# Patient Record
Sex: Female | Born: 1970 | Hispanic: No | Marital: Single | Smoking: Former smoker
Health system: Southern US, Community
[De-identification: ages and names within clinical notes are randomized; demographics above are authoritative.]

## PROBLEM LIST (undated history)

## (undated) DIAGNOSIS — E069 Thyroiditis, unspecified: Secondary | ICD-10-CM

## (undated) HISTORY — DX: Thyroiditis, unspecified: E06.9

---

## 2009-08-19 LAB — CONVERTED CEMR LAB: Pap Smear: NORMAL

## 2009-08-27 ENCOUNTER — Encounter: Payer: Self-pay | Admitting: Endocrinology

## 2009-10-23 ENCOUNTER — Encounter: Payer: Self-pay | Admitting: Endocrinology

## 2009-10-26 ENCOUNTER — Encounter: Payer: Self-pay | Admitting: Endocrinology

## 2009-11-11 ENCOUNTER — Ambulatory Visit: Payer: Self-pay | Admitting: Endocrinology

## 2009-11-11 LAB — CONVERTED CEMR LAB
Free T4: 0.59 ng/dL — ABNORMAL LOW (ref 0.60–1.60)
TSH: 11.95 microintl units/mL — ABNORMAL HIGH (ref 0.35–5.50)

## 2009-12-10 ENCOUNTER — Ambulatory Visit: Payer: Self-pay | Admitting: Endocrinology

## 2009-12-10 DIAGNOSIS — E069 Thyroiditis, unspecified: Secondary | ICD-10-CM | POA: Insufficient documentation

## 2009-12-10 HISTORY — DX: Thyroiditis, unspecified: E06.9

## 2009-12-10 LAB — CONVERTED CEMR LAB: TSH: 0.41 microintl units/mL (ref 0.35–5.50)

## 2010-02-05 ENCOUNTER — Ambulatory Visit: Payer: Self-pay | Admitting: Endocrinology

## 2010-02-05 LAB — CONVERTED CEMR LAB: TSH: 0.94 microintl units/mL (ref 0.35–5.50)

## 2010-04-20 NOTE — Assessment & Plan Note (Signed)
Summary: NEW ENDO CON/ HYPOTHYROID / MEDCOST/ NWS  #   Vital Signs:  Patient profile:   40 year old female Height:      66 inches (167.64 cm) Weight:      189 pounds (85.91 kg) BMI:     30.62 O2 Sat:      98 % on Room air Temp:     98.6 degrees F (37.00 degrees C) oral Pulse rate:   79 / minute BP sitting:   110 / 78  (left arm) Cuff size:   large  Vitals Entered By: Brenton Grills MA (November 11, 2009 9:50 AM)  O2 Flow:  Room air CC: New Endo/hypothyroid/aj   CC:  New Endo/hypothyroid/aj.  History of Present Illness: pt states few mos of slight palpitations in the chest, and associated decreased frequency of menses.  she says just prior to the june, 2011, abnormal tft, she had uti, but did not have a viral illness.  she says sxs haven't changed much over these 2 1/2 mos, except fatigue is worse.  no neck pain.  Current Medications (verified): 1)  None  Allergies (verified): 1)  ! 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  no change in chronic anxiety.  denies blurry vision, muscle weakness, tremor, numbness, edema, abd pain, chest pain, hoarseness, and diarrhea.    Physical Exam  General:  normal appearance.   Head:  head: no  deformity eyes: no periorbital swelling, no proptosis external nose and ears are normal mouth: no lesion seen Neck:  i cannot be certain, but i think the thyroid is slightly enlarged and firm.   Lungs:  Clear to auscultation bilaterally. Normal respiratory effort.  Heart:  Regular rate and rhythm without murmurs or gallops noted. Normal S1,S2.   Abdomen:  abdomen is soft, nontender.  no hepatosplenomegaly.   not distended.  no hernia  Msk:  muscle bulk and strength are grossly normal.  no obvious joint swelling.  gait is normal and steady  Extremities:  no edema no deformity Neurologic:  cn 2-12 grossly intact.   readily moves all 4's.   sensation is intact to touch on all 4's Skin:  normal texture and temp.  no rash.  not diaphoretic  Cervical Nodes:  No significant adenopathy.  Psych:  Alert and cooperative; normal mood and affect; normal attention span and concentration.   Additional Exam:  outside test results are reviewed:  tsh: 08/27/09: 0.09 10/23/09: 19.8 today: TSH     [H]  11.95 uIU/mL                0.35-5.50 Free T4       [L]  0.59 ng/dL      Impression & Recommendations:  Problem # 1:  UNSPECIFIED DISORDER OF THYROID (ICD-246.9) most likely scenario is that pt is in the hypothyroid phase of subacute (  viral) thyroiditis  Problem # 2:  anxiety mild uncertain ir related to #1  Problem # 3:  difficulty with concentration prob due to #1  Medications Added to Medication List This Visit: 1)  Levothyroxine Sodium 50 Mcg Tabs (Levothyroxine sodium) .Marland Kitchen.. 1 once daily  Other Orders: TLB-TSH (Thyroid Stimulating Hormone) (84443-TSH) TLB-T4 (Thyrox), Free [id number]) Consultation Level IV (40981)  Patient Instructions: 1)  recheck thyroid blood tests today.  please call [phone] to hear your test results. 2)  Please schedule a follow-up appointment in 1 month. 3)  (update: i left message on phone-tree: rx synthroid 50/d). Prescriptions: LEVOTHYROXINE SODIUM 50 MCG  TABS (LEVOTHYROXINE SODIUM) 1 once daily  #30 x 1   Entered and Authorized by:   Minus Breeding MD   Signed by:   Minus Breeding MD on 11/11/2009   Method used:   Electronically to        [facility]  [address] [phone]* (retail)       [address]       [place], [place]  [address]       Ph: [phone] or [phone]       Fax: [phone]   RxID:   [id number]    Preventive Care Screening  Pap Smear:    Date:  08/19/2009    Results:  normal   Last Tetanus Booster:    Date:  10/19/2008    Results:  Historical

## 2010-04-20 NOTE — Assessment & Plan Note (Signed)
Summary: 6 week follow up-lb  RS'D PER PT/NWS   Vital Signs:  Patient profile:   40 year old female Height:      66 inches (167.64 cm) Weight:      196.13 pounds (89.15 kg) BMI:     31.77 O2 Sat:      97 % on Room air Temp:     99.2 degrees F (37.33 degrees C) oral Pulse rate:   91 / minute BP sitting:   118 / 78  (left arm) Cuff size:   large  Vitals Entered By: Brenton Grills CMA Duncan Dull) (February 05, 2010 9:54 AM)  O2 Flow:  Room air CC: Follow-up visit/aj Is Patient Diabetic? No   CC:  Follow-up visit/aj.  History of Present Illness: pt has h/o subacute (viral) thyroiditis.  she is now on synthroid 50 micrograms/day.  pt states she feels well in general, except for some difficulty with concentration.  overall, no change in sxs.  Current Medications (verified): 1)  Levothyroxine Sodium 50 Mcg Tabs (Levothyroxine Sodium) .Marland Kitchen.. 1 Once Daily  Allergies (verified): 1)  ! Dilaudid (Hydromorphone Hcl)  Past History:  Past Medical History: Last updated: 11/11/2009 UNSPECIFIED DISORDER OF THYROID (ICD-246.9) FAMILY HISTORY DIABETES 1ST DEGREE RELATIVE (ICD-V18.0) FAMILY HISTORY BREAST CANCER 1ST DEGREE RELATIVE <50 (ICD-V16.3)  Social History: Reviewed history from 11/11/2009 and no changes required. 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   Neck:  thyroid seems normal to me Additional Exam:  FastTSH                   0.94 uIU/mL    Impression & Recommendations:  Problem # 1:  THYROIDITIS (ICD-245.9) euthyroid on synthroid  Medications Added to Medication List This Visit: 1)  Tsh 245.9   Other Orders: TLB-TSH (Thyroid Stimulating Hormone) (84443-TSH) Est. Patient Level III (04540)  Patient Instructions: 1)  recheck thyroid blood tests today.  please call [phone] to hear your test results.     2)  pending the test results, please continue the same medications for now, and do tsh blood test in 3 months.  3)  Please schedule a follow-up appointment with the doctor at [facility] in 6 months, and return here as needed. 4)  (update: i left message on phone-tree:  rx as we discussed) Prescriptions: LEVOTHYROXINE SODIUM 50 MCG TABS (LEVOTHYROXINE SODIUM) 1 once daily  #90 x 3   Entered and Authorized by:   Minus Breeding MD   Signed by:   Minus Breeding MD on 02/05/2010   Method used:   Electronically to        [facility] [address] [phone]* (retail)       [address] [place], [place]  [address]       Ph: [phone]       Fax: [phone]   RxID:   [id number] TSH 245.9   #0 x 0   Entered and Authorized by:   Minus Breeding MD   Signed by:   Minus Breeding MD on 02/05/2010   Method used:   Print then Give to Patient   RxID:   [id number]    Orders Added: 1)  TLB-TSH (Thyroid Stimulating Hormone) [84443-TSH] 2)  Est. Patient Level III [51884]

## 2010-04-20 NOTE — Letter (Signed)
Summary: [facility]   Imported By: Lennie Odor 11/13/2009 11:34:12  _____________________________________________________________________  External Attachment:    Type:   Image     Comment:   External Document

## 2010-04-20 NOTE — Assessment & Plan Note (Signed)
Summary: 1 month f/u #/cd   Vital Signs:  Patient profile:   40 year old female Height:      66 inches (167.64 cm) Weight:      187.75 pounds (85.34 kg) BMI:     30.41 O2 Sat:      99 % on Room air Temp:     97.7 degrees F (36.50 degrees C) oral Pulse rate:   86 / minute BP sitting:   124 / 72  (left arm) Cuff size:   large  Vitals Entered By: Brenton Grills MA (December 10, 2009 3:10 PM)  O2 Flow:  Room air CC: 1 month F/U/aj   CC:  1 month F/U/aj.  History of Present Illness: pt has h/o subacute (viral) thyroiditis.  she is now on synthroid 50 micrograms/day.  pt states she feels well in general, except for some difficulty with concentration.  she still has diaphoresis.  Current Medications (verified): 1)  Levothyroxine Sodium 50 Mcg Tabs (Levothyroxine Sodium) .Marland Kitchen.. 1 Once Daily  Allergies (verified): 1)  ! Dilaudid (Hydromorphone Hcl)  Past History:  Past Medical History: Last updated: 11/11/2009 UNSPECIFIED DISORDER OF THYROID (ICD-246.9) FAMILY HISTORY DIABETES 1ST DEGREE RELATIVE (ICD-V18.0) FAMILY HISTORY BREAST CANCER 1ST DEGREE RELATIVE <50 (ICD-V16.3)  Review of Systems  The patient denies weight loss and weight gain.    Physical Exam  General:  normal appearance.   Neck:  thyroid seems normal to me Additional Exam:  FastTSH                   0.41 uIU/mL       Impression & Recommendations:  Problem # 1:  THYROIDITIS (ICD-245.9) subacute.  in hypothyroid phase now.  well-replaced.  Other Orders: TLB-TSH (Thyroid Stimulating Hormone) (84443-TSH) Est. Patient Level III ([address])  Patient Instructions: 1)  recheck thyroid blood tests today.  please call [phone] to hear your test results. 2)  Please schedule a follow-up appointment in 6 weeks. Prescriptions: LEVOTHYROXINE SODIUM 50 MCG TABS (LEVOTHYROXINE SODIUM) 1 once daily  #30 x 1   Entered and Authorized by:   Minus Breeding MD   Signed by:   Minus Breeding MD on 12/11/2009   Method used:    [facility]        [facility]  [address] [phone]* (retail)       [address]       [place], [place]  [address]       Ph: [phone] or [phone]       Fax: [phone]   RxID:   [id number]

## 2010-08-19 ENCOUNTER — Telehealth: Payer: Self-pay

## 2010-08-19 NOTE — Telephone Encounter (Signed)
Please change appt to a convenient time.  appts are good because, for example, your thyroid condition places you at risk for the development of a lump in the thyroid.

## 2010-08-19 NOTE — Telephone Encounter (Signed)
Pt called stating she has follow up appt with SAE tomorrow at 04:15p. Pt states that due to her schedule it it difficult for her to come in for appt, pt is requesting labs only, please advise.

## 2010-08-19 NOTE — Telephone Encounter (Signed)
Pt advised via VM per request

## 2010-08-20 ENCOUNTER — Ambulatory Visit (INDEPENDENT_AMBULATORY_CARE_PROVIDER_SITE_OTHER): Payer: PRIVATE HEALTH INSURANCE | Admitting: Endocrinology

## 2010-08-20 ENCOUNTER — Encounter: Payer: Self-pay | Admitting: Endocrinology

## 2010-08-20 ENCOUNTER — Other Ambulatory Visit (INDEPENDENT_AMBULATORY_CARE_PROVIDER_SITE_OTHER): Payer: PRIVATE HEALTH INSURANCE

## 2010-08-20 VITALS — BP 118/72 | HR 107 | Temp 98.5°F | Ht 66.5 in | Wt 200.6 lb

## 2010-08-20 DIAGNOSIS — E069 Thyroiditis, unspecified: Secondary | ICD-10-CM

## 2010-08-20 NOTE — Progress Notes (Signed)
  Subjective:    Patient ID: Debra Wall, female    DOB: 1970/03/29, 40 y.o.   MRN: [id number]  HPI Pt returns for for f/u of subacute thyroiditis.  She has not recently taken synthroid.  pt states she feels well in general, except for weight gain.   Past Medical History  Diagnosis Date  . THYROIDITIS 12/10/2009    No past surgical history on file.  History   Social History  . Marital Status: Single    Spouse Name: N/A    Number of Children: N/A  . Years of Education: N/A   Occupational History  . 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 Smoking status: Former Games developer  . Smokeless tobacco: Not on file  . Alcohol Use: No  . Drug Use: No  . Sexually Active:    Other Topics Concern  . Not on file   Social History Narrative   Regular exercise-yes    No current outpatient prescriptions on file prior to visit.    Allergies  Allergen Reactions  . Hydromorphone Hcl     Family History  Problem Relation Age of Onset  . Arthritis Other   . Cancer Other     FH of Breast Cancer 1st degree relative  . Diabetes Other   . Hypertension Other   . Stroke Other     BP 118/72  Pulse 107  Temp(Src) 98.5 F (36.9 C) (Oral)  Ht 5' 6.5" (1.689 m)  Wt 200 lb 9.6 oz (90.992 kg)  BMI 31.89 kg/m2  SpO2 97%  LMP 07/25/2010    Review of Systems Denies neck pain.    Objective:   Physical Exam GENERAL: no distress Neck: no goiter.  No thyroid nodule.     Lab Results  Component Value Date   TSH 1.40 08/20/2010   Assessment & Plan:  Subacute thyroiditis, now euthyroid off synthroid

## 2010-08-20 NOTE — Patient Instructions (Addendum)
blood tests are being ordered for you today.  please call [phone] to hear your test results.  You will be prompted to enter the 9-digit "MRN" number that appears at the top left of this page, followed by #.  Then you will hear the message. Return here as needed.   You are at risk for a permanently low thyroid, or (rarely) for thyroid cancer.  Each year, you should have a thyroid blood test, and have a doctor feel your thyroid.  This would be most easily done at your annual gynecology appointment. (update: i left message on phone-tree:  rx as we discussed).

## 2012-04-23 ENCOUNTER — Other Ambulatory Visit: Payer: Self-pay | Admitting: Internal Medicine

## 2012-04-23 DIAGNOSIS — Z1231 Encounter for screening mammogram for malignant neoplasm of breast: Secondary | ICD-10-CM

## 2012-04-25 ENCOUNTER — Ambulatory Visit
Admission: RE | Admit: 2012-04-25 | Discharge: 2012-04-25 | Disposition: A | Discharge status: Home / Self Care | Payer: BC Managed Care – PPO | Source: Ambulatory Visit | Attending: Internal Medicine | Admitting: Internal Medicine

## 2012-04-25 DIAGNOSIS — Z1231 Encounter for screening mammogram for malignant neoplasm of breast: Secondary | ICD-10-CM

## 2012-09-05 ENCOUNTER — Other Ambulatory Visit: Payer: Self-pay | Admitting: Family Medicine

## 2012-09-05 DIAGNOSIS — R609 Edema, unspecified: Secondary | ICD-10-CM

## 2012-09-06 ENCOUNTER — Ambulatory Visit
Admission: RE | Admit: 2012-09-06 | Discharge: 2012-09-06 | Disposition: A | Discharge status: Home / Self Care | Payer: BC Managed Care – PPO | Source: Ambulatory Visit | Attending: Family Medicine | Admitting: Family Medicine

## 2012-09-06 DIAGNOSIS — R609 Edema, unspecified: Secondary | ICD-10-CM

## 2013-04-15 ENCOUNTER — Other Ambulatory Visit: Payer: Self-pay

## 2013-04-15 DIAGNOSIS — Z1231 Encounter for screening mammogram for malignant neoplasm of breast: Secondary | ICD-10-CM

## 2013-05-06 ENCOUNTER — Ambulatory Visit
Admission: RE | Admit: 2013-05-06 | Discharge: 2013-05-06 | Disposition: A | Discharge status: Home / Self Care | Payer: BC Managed Care – PPO | Source: Ambulatory Visit

## 2013-05-06 DIAGNOSIS — Z1231 Encounter for screening mammogram for malignant neoplasm of breast: Secondary | ICD-10-CM

## 2013-05-14 ENCOUNTER — Other Ambulatory Visit: Payer: Self-pay | Admitting: Internal Medicine

## 2013-05-14 DIAGNOSIS — R928 Other abnormal and inconclusive findings on diagnostic imaging of breast: Secondary | ICD-10-CM

## 2013-05-27 ENCOUNTER — Ambulatory Visit
Admission: RE | Admit: 2013-05-27 | Discharge: 2013-05-27 | Disposition: A | Discharge status: Home / Self Care | Payer: BC Managed Care – PPO | Source: Ambulatory Visit | Attending: Internal Medicine | Admitting: Internal Medicine

## 2013-05-27 ENCOUNTER — Ambulatory Visit
Admission: RE | Admit: 2013-05-27 | Discharge: 2013-05-27 | Disposition: A | Discharge status: Home / Self Care | Payer: Self-pay | Source: Ambulatory Visit | Attending: Internal Medicine | Admitting: Internal Medicine

## 2013-05-27 DIAGNOSIS — R928 Other abnormal and inconclusive findings on diagnostic imaging of breast: Secondary | ICD-10-CM

## 2013-09-25 ENCOUNTER — Encounter: Payer: Self-pay | Admitting: Endocrinology

## 2013-09-25 ENCOUNTER — Ambulatory Visit (INDEPENDENT_AMBULATORY_CARE_PROVIDER_SITE_OTHER): Payer: BC Managed Care – PPO | Admitting: Endocrinology

## 2013-09-25 VITALS — BP 118/80 | HR 111 | Temp 98.0°F | Ht 66.5 in | Wt 215.0 lb

## 2013-09-25 DIAGNOSIS — E069 Thyroiditis, unspecified: Secondary | ICD-10-CM

## 2013-09-25 DIAGNOSIS — N911 Secondary amenorrhea: Secondary | ICD-10-CM

## 2013-09-25 DIAGNOSIS — N912 Amenorrhea, unspecified: Secondary | ICD-10-CM

## 2013-09-25 NOTE — Patient Instructions (Signed)
blood tests are being requested for you today.  We'll contact you with results. When you see the neurologist, she may raise the possibility of MS.

## 2013-09-25 NOTE — Progress Notes (Signed)
Subjective:    Patient ID: Debra Wall, female    DOB: 02/19/1971, 43 y.o.   MRN: [id number]  HPI Pt returns for f/u of chronic hypothyroidism (she presented with subacute thyroiditis in 2011, but she eventually needed to continue synthroid.  She continued this until 1 month ago, when she stopped due to severe numbness of the hands, and assoc arthralgias  Since off it, she feels only slightly worse.  she has never taken kelp or any other type of non-prescribed thyroid product.  She had thyroid US in 2014--bx was considered, but when she went for this, the nodule apparently did not meet criteria.  She is not considering a pregnancy.  she has never had thyroid surgery, or XRT to the neck.  she has never been on amiodarone or lithium.   Past Medical History  Diagnosis Date  . THYROIDITIS 12/10/2009    No past surgical history on file.  History   Social History  . Marital Status: Single    Spouse Name: N/A    Number of Children: N/A  . Years of Education: N/A   Occupational History  . 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 Smoking status: Former Games developermoker  . Smokeless tobacco: Not on file  . Alcohol Use: No  . Drug Use: No  . Sexual Activity:    Other Topics Concern  . Not on file   Social History Narrative   Regular exercise-yes    No current outpatient prescriptions on file prior to visit.   No current facility-administered medications on file prior to visit.    Allergies  Allergen Reactions  . Hydromorphone Hcl     Family History  Problem Relation Age of Onset  . Arthritis Other   . Cancer Other     FH of Breast Cancer 1st degree relative  . Diabetes Other   . Hypertension Other   . Stroke Other     BP 118/80  Pulse 111  Temp(Src) 98 F (36.7 C) (Oral)  Ht 5' 6.5" (1.689 m)  Wt 215 lb (97.523 kg)  BMI 34.19 kg/m2  SpO2 97%    Review of Systems She has difficulty with concentration, intermittent visual loss, leg cramps, weight gain, hair  loss, and decreasing frequency of menses.  denies depression, sob, constipation, cold intolerance, dry skin, rhinorrhea, easy bruising, and syncope.       Objective:   Physical Exam VS: see vs page GEN: no distress HEAD: head: no deformity eyes: no periorbital swelling, no proptosis external nose and ears are normal mouth: no lesion seen NECK: supple, thyroid is not enlarged CHEST WALL: no deformity LUNGS:  Clear to auscultation.   CV: reg rate and rhythm, no murmur ABD: abdomen is soft, nontender.  no hepatosplenomegaly.  not distended.  no hernia MUSCULOSKELETAL: muscle bulk and strength are grossly normal.  no obvious joint swelling.  gait is normal and steady.   EXTEMITIES: no deformity.  no edema.   PULSES: no carotid bruit NEURO:  cn 2-12 grossly intact.   readily moves all 4's.  sensation is intact to touch on all 4's, but decreased from normal.   SKIN:  Normal texture and temperature.  No rash or suspicious lesion is visible.   NODES:  None palpable at the neck.   PSYCH: alert, well-oriented.  Does not appear anxious nor depressed.   outside test results are reviewed: TFT=normal (march, 2015)  i have reviewed the following outside records: Office notes   Today:  Lab Results  Component Value Date   TSH 3.98 09/25/2013       Assessment & Plan:  Thyroiditis: TFT are normal both on and off synthroid.  This is caused by either fluctuating dosage requirement (less likely), vs intermittent medication compliance. Thyroid nodule, apparently resolved spontaneously. Numbness, new to me--? MS.  i told pt she should see neurologist soon.    Patient is advised the following: Patient Instructions  blood tests are being requested for you today.  We'll contact you with results. When you see the neurologist, she may raise the possibility of MS.

## 2013-09-26 ENCOUNTER — Telehealth: Payer: Self-pay | Admitting: Endocrinology

## 2013-09-26 LAB — T4, FREE: FREE T4: 0.77 ng/dL (ref 0.60–1.60)

## 2013-09-26 LAB — LUTEINIZING HORMONE: LH: 31.19 m[IU]/mL

## 2013-09-26 LAB — TSH: TSH: 3.98 u[IU]/mL (ref 0.35–4.50)

## 2013-09-26 LAB — FOLLICLE STIMULATING HORMONE: FSH: 19.9 m[IU]/mL

## 2013-09-26 LAB — T3, FREE: T3 FREE: 3.3 pg/mL (ref 2.3–4.2)

## 2013-09-26 NOTE — Telephone Encounter (Signed)
Pt needs labs and notes to be sent to Dr. Mellody LifeJohn Carmack F# [phone]; P# [phone]

## 2013-09-27 NOTE — Telephone Encounter (Signed)
Faxed to Dr. Isidore Moosffice.

## 2013-10-18 ENCOUNTER — Ambulatory Visit: Payer: BC Managed Care – PPO | Admitting: Neurology

## 2013-10-21 ENCOUNTER — Telehealth: Payer: Self-pay | Admitting: Neurology

## 2013-10-21 NOTE — Telephone Encounter (Signed)
Pt cancel appt on 10-18-13 and did not resch

## 2013-12-10 IMAGING — US US EXTREM LOW VENOUS*L*
1 series · 14 of 24 positions shown · non-contrast
Comparison: None

CLINICAL DATA: LEFT LEG PAIN AND SWELLING ? DVT;;

LEFT LOWER EXTREMITY VENOUS DUPLEX ULTRASOUND
TECHNIQUE: Gray-scale sonography with compression, as well as color
and duplex ultrasound, were performed to evaluate the deep venous
system from the level of the common femoral vein through the
popliteal and proximal calf veins.

[Series 1: us extrem low venous*left* · 14 of 30 slices shown]
[im 1/30]
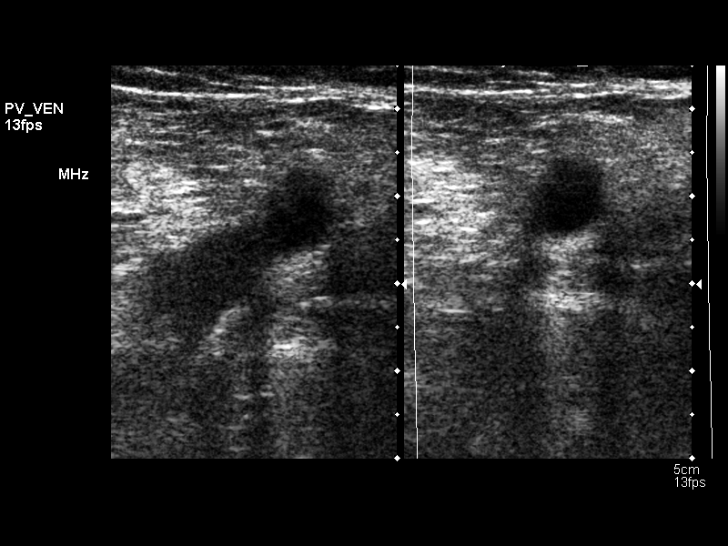
[im 3/30]
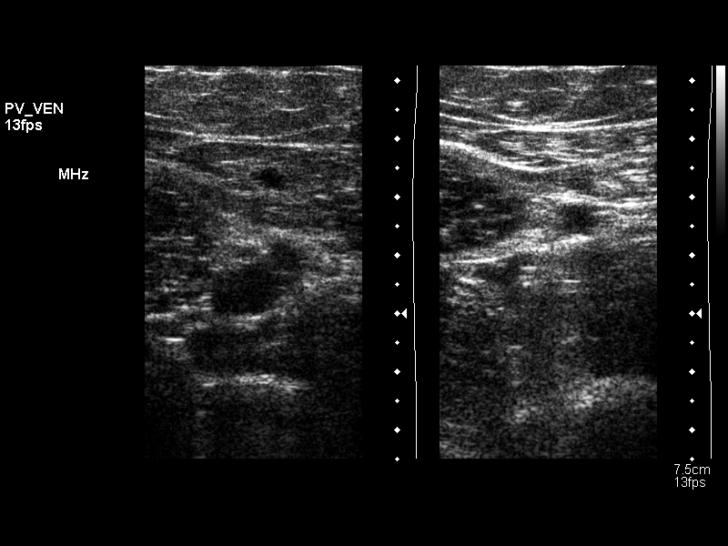
[im 6/30]
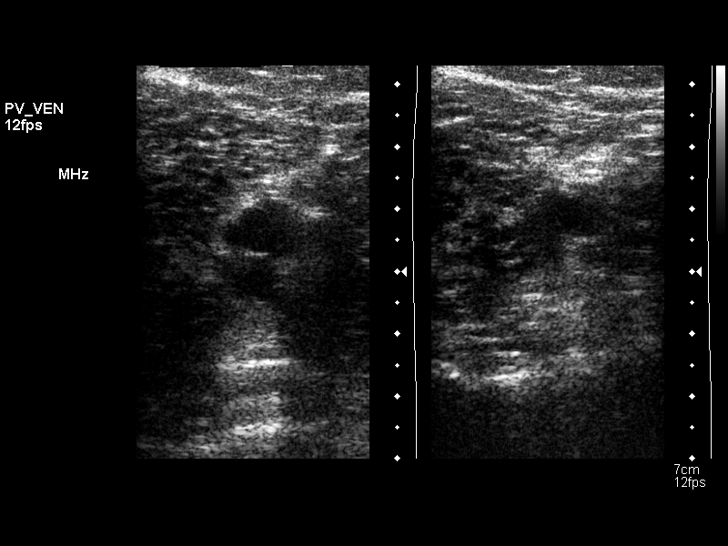
[im 8/30]
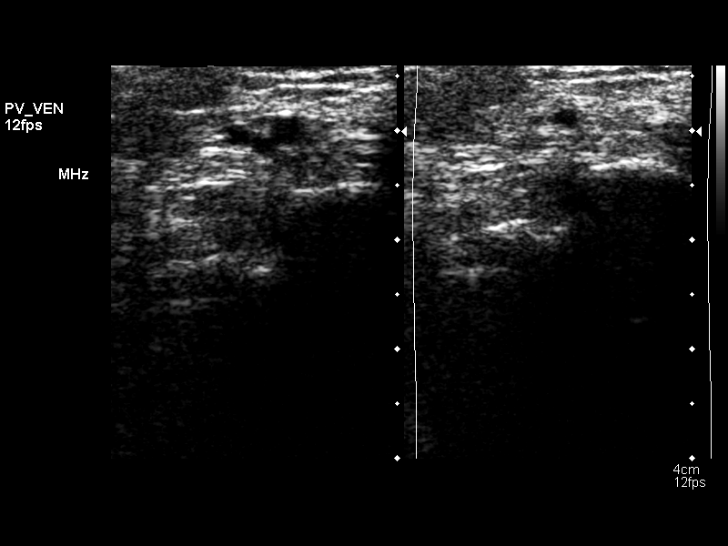
[im 9/30]
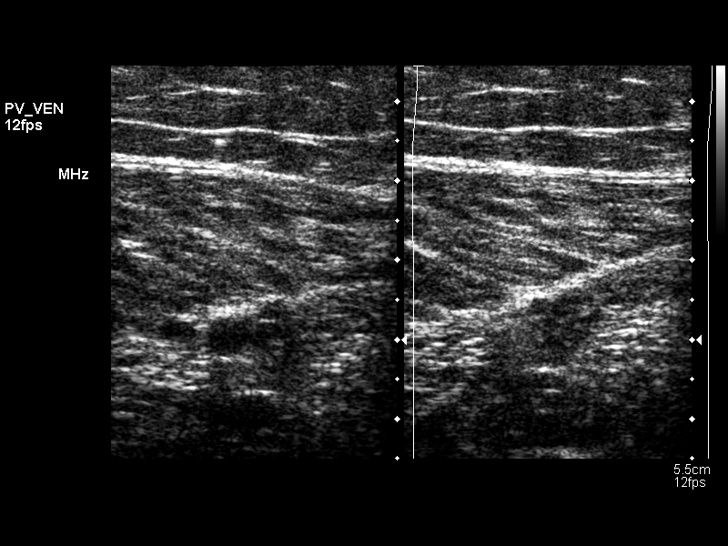
[im 12/30]
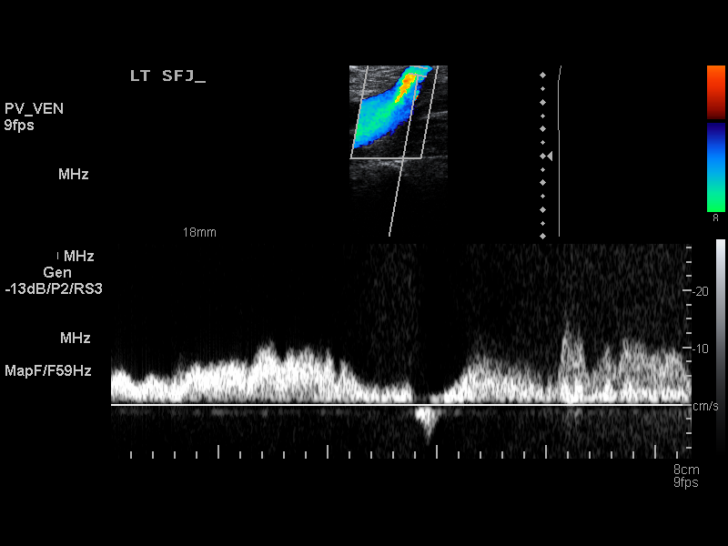
[im 14/30]
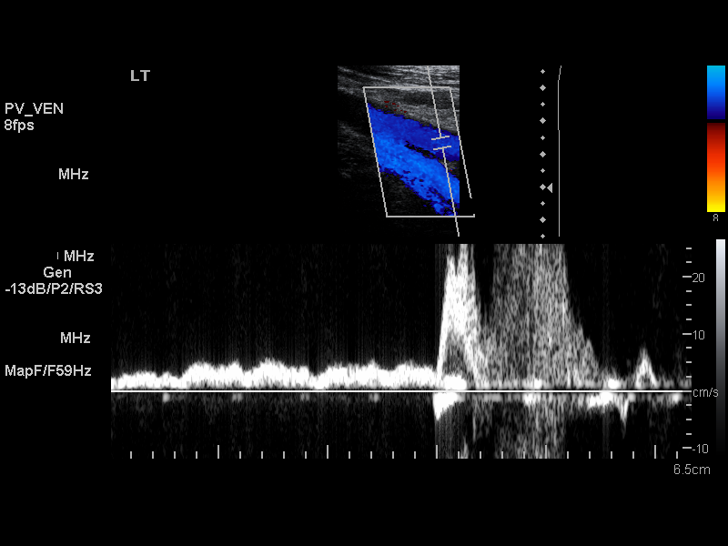
[im 16/30]
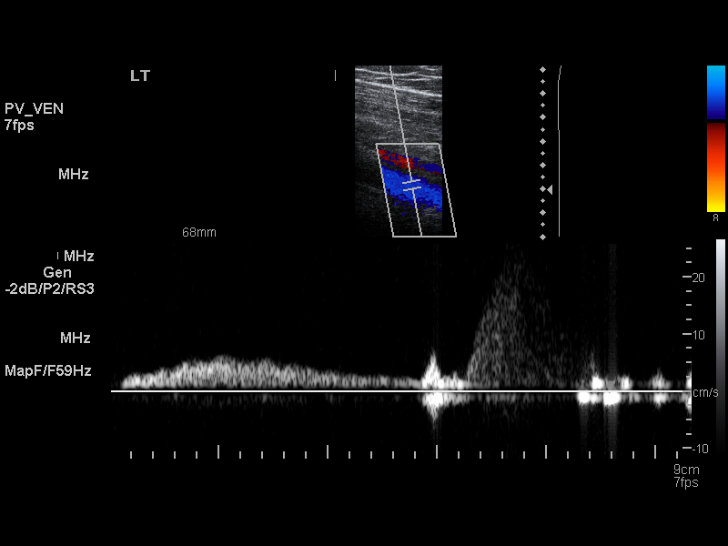
[im 18/30]
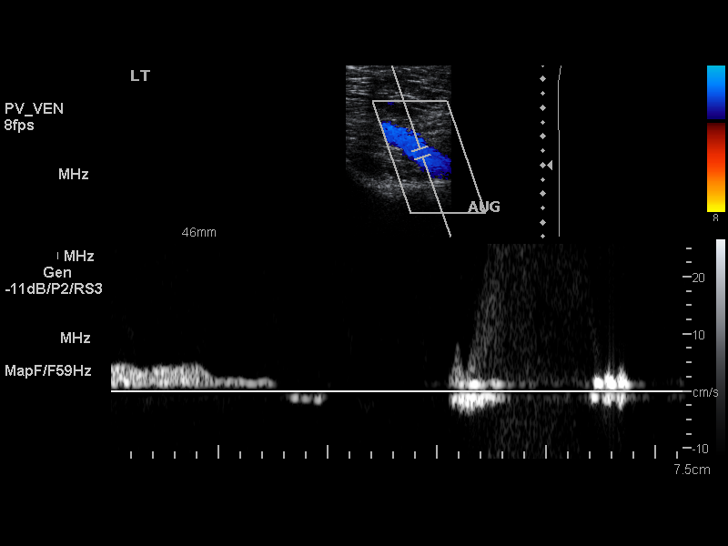
[im 21/30]
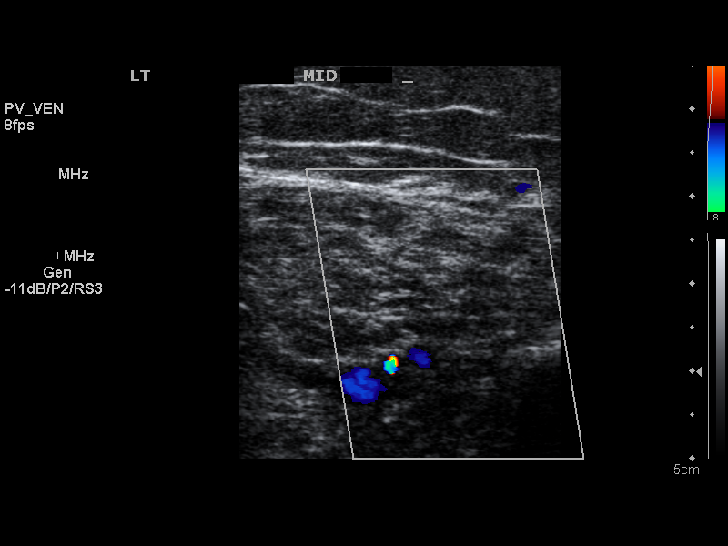
[im 23/30]
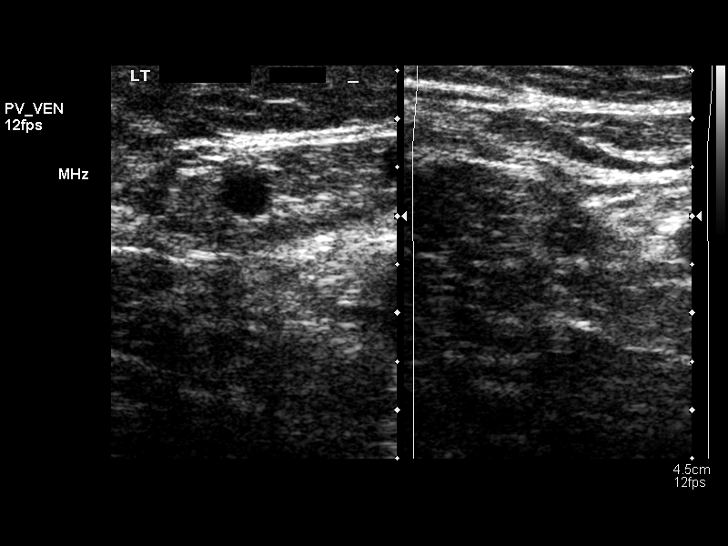
[im 24/30]
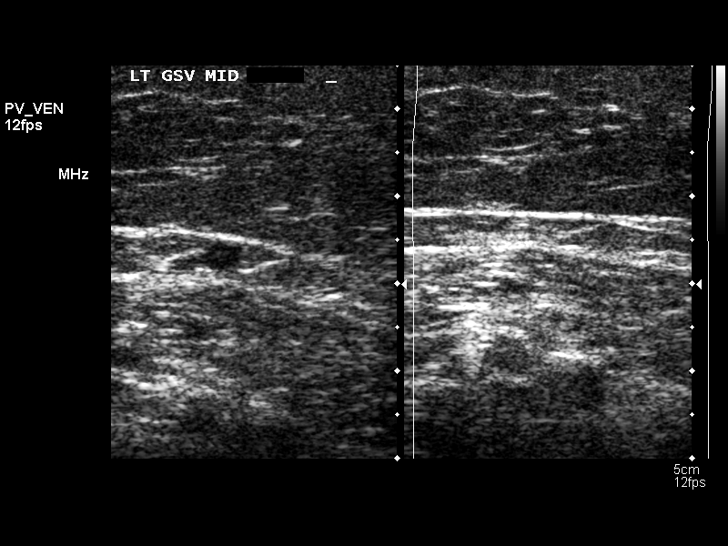
[im 27/30]
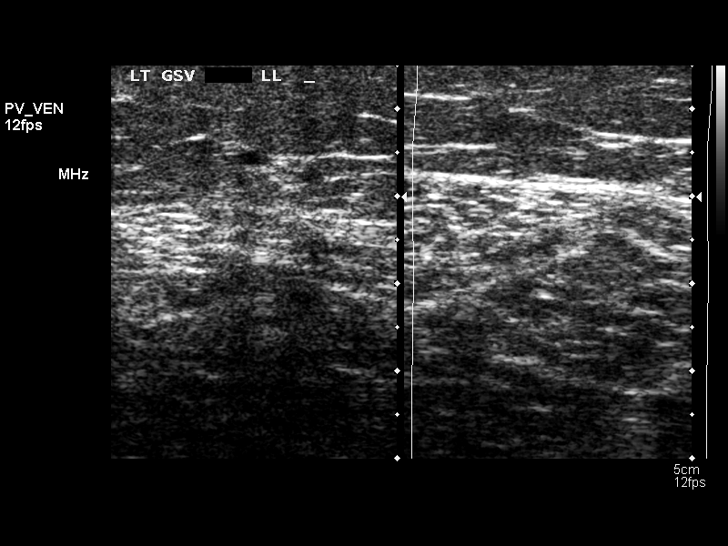
[im 30/30]
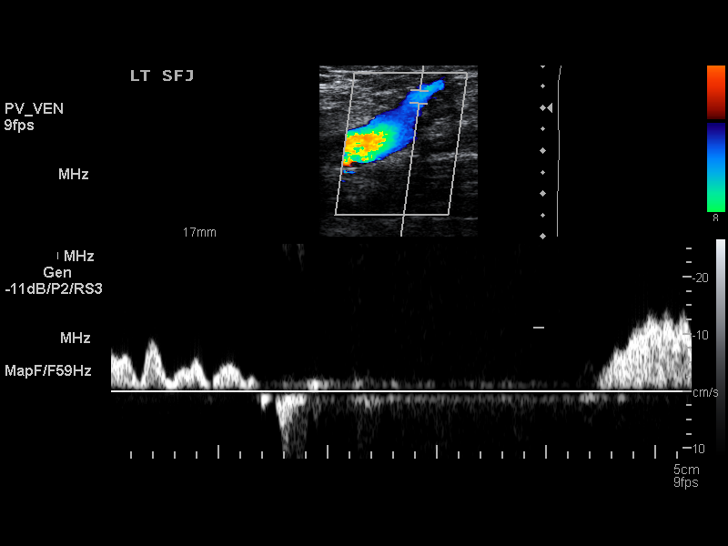

[14 of 24 positions shown; findings below may reference images not displayed]

FINDINGS: Normal compressibility and normal Doppler signal within
the common femoral, superficial femoral and popliteal veins, down
to the proximal calf veins.  No grayscale filling defects to
suggest DVT.
IMPRESSION: No evidence of left lower extremity deep vein thrombosis.
